# Patient Record
Sex: Male | Born: 1975
Health system: Southern US, Community
[De-identification: ages and names within clinical notes are randomized; demographics above are authoritative.]

## PROBLEM LIST (undated history)

## (undated) DIAGNOSIS — W3400XA Accidental discharge from unspecified firearms or gun, initial encounter: Secondary | ICD-10-CM

## (undated) DIAGNOSIS — M549 Dorsalgia, unspecified: Secondary | ICD-10-CM

## (undated) DIAGNOSIS — R569 Unspecified convulsions: Secondary | ICD-10-CM

## (undated) HISTORY — PX: BACK SURGERY: SHX140

## (undated) HISTORY — PX: ROTATOR CUFF REPAIR: SHX139

## (undated) HISTORY — PX: OTHER SURGICAL HISTORY: SHX169

---

## 1999-12-11 ENCOUNTER — Inpatient Hospital Stay (HOSPITAL_COMMUNITY): Admission: EM | Admit: 1999-12-11 | Discharge: 1999-12-13 | Payer: Self-pay | Admitting: Emergency Medicine

## 1999-12-11 ENCOUNTER — Encounter: Payer: Self-pay | Admitting: Orthopedic Surgery

## 1999-12-11 ENCOUNTER — Encounter: Payer: Self-pay | Admitting: Emergency Medicine

## 2005-01-23 ENCOUNTER — Emergency Department (HOSPITAL_COMMUNITY): Admission: EM | Admit: 2005-01-23 | Discharge: 2005-01-23 | Payer: Self-pay | Admitting: Emergency Medicine

## 2010-07-10 ENCOUNTER — Emergency Department (HOSPITAL_BASED_OUTPATIENT_CLINIC_OR_DEPARTMENT_OTHER)
Admission: EM | Admit: 2010-07-10 | Discharge: 2010-07-10 | Payer: Self-pay | Source: Home / Self Care | Admitting: Emergency Medicine

## 2010-10-22 ENCOUNTER — Emergency Department (HOSPITAL_BASED_OUTPATIENT_CLINIC_OR_DEPARTMENT_OTHER)
Admission: EM | Admit: 2010-10-22 | Discharge: 2010-10-22 | Disposition: A | Discharge status: Home / Self Care | Payer: Self-pay | Attending: Emergency Medicine | Admitting: Emergency Medicine

## 2010-10-22 DIAGNOSIS — M549 Dorsalgia, unspecified: Secondary | ICD-10-CM | POA: Insufficient documentation

## 2010-10-22 DIAGNOSIS — M5380 Other specified dorsopathies, site unspecified: Secondary | ICD-10-CM | POA: Insufficient documentation

## 2010-10-22 DIAGNOSIS — F17200 Nicotine dependence, unspecified, uncomplicated: Secondary | ICD-10-CM | POA: Insufficient documentation
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
## 2011-11-21 ENCOUNTER — Emergency Department (INDEPENDENT_AMBULATORY_CARE_PROVIDER_SITE_OTHER): Payer: Self-pay

## 2011-11-21 ENCOUNTER — Encounter (HOSPITAL_BASED_OUTPATIENT_CLINIC_OR_DEPARTMENT_OTHER): Payer: Self-pay

## 2011-11-21 ENCOUNTER — Emergency Department (HOSPITAL_BASED_OUTPATIENT_CLINIC_OR_DEPARTMENT_OTHER)
Admission: EM | Admit: 2011-11-21 | Discharge: 2011-11-21 | Disposition: A | Discharge status: Home / Self Care | Payer: Self-pay | Attending: Emergency Medicine | Admitting: Emergency Medicine

## 2011-11-21 DIAGNOSIS — R5383 Other fatigue: Secondary | ICD-10-CM

## 2011-11-21 DIAGNOSIS — M79609 Pain in unspecified limb: Secondary | ICD-10-CM

## 2011-11-21 DIAGNOSIS — IMO0002 Reserved for concepts with insufficient information to code with codable children: Secondary | ICD-10-CM | POA: Insufficient documentation

## 2011-11-21 DIAGNOSIS — M5410 Radiculopathy, site unspecified: Secondary | ICD-10-CM

## 2011-11-21 DIAGNOSIS — R5381 Other malaise: Secondary | ICD-10-CM

## 2011-11-21 DIAGNOSIS — M545 Low back pain, unspecified: Secondary | ICD-10-CM | POA: Insufficient documentation

## 2011-11-21 DIAGNOSIS — M5137 Other intervertebral disc degeneration, lumbosacral region: Secondary | ICD-10-CM

## 2011-11-21 HISTORY — DX: Accidental discharge from unspecified firearms or gun, initial encounter: W34.00XA

## 2011-11-21 HISTORY — DX: Unspecified convulsions: R56.9

## 2011-11-21 HISTORY — DX: Dorsalgia, unspecified: M54.9

## 2011-11-21 MED ORDER — PREDNISONE 10 MG PO TABS: 20.0000 mg | ORAL_TABLET | Freq: Two times a day (BID) | ORAL | Status: DC

## 2011-11-21 MED ORDER — OXYCODONE-ACETAMINOPHEN 5-325 MG PO TABS: 1.0000 | ORAL_TABLET | Freq: Four times a day (QID) | ORAL | Status: AC | PRN

## 2011-11-21 MED ORDER — MORPHINE SULFATE 10 MG/ML IJ SOLN
10.0000 mg | Freq: Once | INTRAMUSCULAR | Status: AC
  Administered 2011-11-21: 10 mg via INTRAMUSCULAR
  Filled 2011-11-21: qty 1

## 2011-11-21 MED ORDER — OXYCODONE-ACETAMINOPHEN 5-325 MG PO TABS
2.0000 | ORAL_TABLET | Freq: Once | ORAL | Status: AC
  Administered 2011-11-21: 2 via ORAL
  Filled 2011-11-21: qty 2

## 2011-11-21 NOTE — ED Notes (Signed)
Pt reports back pain x 2 weeks radiating to right leg with numbness.

## 2011-11-21 NOTE — ED Notes (Signed)
MD at bedside. 

## 2011-11-21 NOTE — Discharge Instructions (Signed)

## 2011-11-21 NOTE — ED Notes (Signed)
Pt medicated and preparing for MRI transport.

## 2011-11-21 NOTE — ED Provider Notes (Signed)
History     CSN: [id number]  Arrival date & time 11/21/11  4098   First MD Initiated Contact with Patient 11/21/11 1008      Chief Complaint  Patient presents with  . Back Pain    (Consider location/radiation/quality/duration/timing/severity/associated sxs/prior treatment) HPI Comments: Several month history of low back pain.  Worse the past few days.  Today, walking out of class, became severe, radiated into the right hip and the right foot is numb and weak.  No bowel or bladder complaints.  Patient is a 36 y.o. male presenting with back pain. The history is provided by the patient.  Back Pain  This is a new problem. Episode onset: several months ago. The problem occurs constantly. The pain is associated with no known injury. The pain is present in the lumbar spine. The quality of the pain is described as stabbing. The pain radiates to the right thigh and right foot. The pain is at a severity of 10/10. The pain is severe. The symptoms are aggravated by twisting, bending and certain positions. The pain is the same all the time. He has tried NSAIDs for the symptoms. The treatment provided no relief.    Past Medical History  Diagnosis Date  . Back pain     Past Surgical History  Procedure Date  . Arm surgery     No family history on file.  History  Substance Use Topics  . Smoking status: Current Everyday Smoker -- 0.5 packs/day  . Smokeless tobacco: Not on file  . Alcohol Use: Yes     occasional      Review of Systems  Musculoskeletal: Positive for back pain.  All other systems reviewed and are negative.    Allergies  Septra  Home Medications  No current outpatient prescriptions on file.  BP 136/90  Pulse 60  Temp(Src) 98.1 F (36.7 C) (Oral)  Resp 20  Ht 6' (1.829 m)  Wt 205 lb (92.987 kg)  BMI 27.80 kg/m2  SpO2 95%  Physical Exam  Nursing note and vitals reviewed. Constitutional: He is oriented to person, place, and time. He appears well-developed  and well-nourished.       He appears quite uncomfortable.  HENT:  Head: Normocephalic and atraumatic.  Neck: Normal range of motion. Neck supple.  Abdominal: Soft. Bowel sounds are normal.  Musculoskeletal: He exhibits no edema.       There is ttp in the soft tissues of the lumbar spine.    Neurological: He is alert and oriented to person, place, and time.       DTR's are 1+ and equal in the ble.  Strength is 4/5 in the rle and 5/5 in the lle.    Skin: Skin is dry.    ED Course  Procedures (including critical care time)  Labs Reviewed - No data to display No results found.   No diagnosis found.    MDM  The mri shows bulging discs but no evidence of nerve compression.  He is feeling somewhat better with morphine.  Will discharge to home with prednisone and percocet.  To follow up with pcp if not improving.        Geoffery Lyons, MD 11/21/11 1218

## 2011-11-21 NOTE — ED Notes (Signed)
Sensation present on medial aspect of right lower leg.  Pt has difficult lifting right leg.  Neuro otherwise intact.

## 2011-11-21 NOTE — ED Notes (Signed)
Family at bedside. 

## 2011-11-21 NOTE — ED Notes (Signed)
Pt returned from MRI °

## 2011-11-21 NOTE — ED Notes (Signed)
Patient transported to MRI via wc 

## 2011-11-21 NOTE — ED Notes (Signed)
MRI Tech at bedside.

## 2012-11-11 ENCOUNTER — Emergency Department (HOSPITAL_BASED_OUTPATIENT_CLINIC_OR_DEPARTMENT_OTHER)
Admission: EM | Admit: 2012-11-11 | Discharge: 2012-11-11 | Disposition: A | Discharge status: Home / Self Care | Payer: Self-pay | Attending: Emergency Medicine | Admitting: Emergency Medicine

## 2012-11-11 ENCOUNTER — Encounter (HOSPITAL_BASED_OUTPATIENT_CLINIC_OR_DEPARTMENT_OTHER): Payer: Self-pay | Admitting: *Deleted

## 2012-11-11 DIAGNOSIS — S0990XA Unspecified injury of head, initial encounter: Secondary | ICD-10-CM

## 2012-11-11 DIAGNOSIS — Z8739 Personal history of other diseases of the musculoskeletal system and connective tissue: Secondary | ICD-10-CM | POA: Insufficient documentation

## 2012-11-11 DIAGNOSIS — IMO0002 Reserved for concepts with insufficient information to code with codable children: Secondary | ICD-10-CM | POA: Insufficient documentation

## 2012-11-11 DIAGNOSIS — Y9289 Other specified places as the place of occurrence of the external cause: Secondary | ICD-10-CM | POA: Insufficient documentation

## 2012-11-11 DIAGNOSIS — Y9389 Activity, other specified: Secondary | ICD-10-CM | POA: Insufficient documentation

## 2012-11-11 DIAGNOSIS — W268XXA Contact with other sharp object(s), not elsewhere classified, initial encounter: Secondary | ICD-10-CM | POA: Insufficient documentation

## 2012-11-11 DIAGNOSIS — Z8669 Personal history of other diseases of the nervous system and sense organs: Secondary | ICD-10-CM | POA: Insufficient documentation

## 2012-11-11 DIAGNOSIS — Z87828 Personal history of other (healed) physical injury and trauma: Secondary | ICD-10-CM | POA: Insufficient documentation

## 2012-11-11 DIAGNOSIS — S0005XA Superficial foreign body of scalp, initial encounter: Secondary | ICD-10-CM | POA: Insufficient documentation

## 2012-11-11 DIAGNOSIS — F17200 Nicotine dependence, unspecified, uncomplicated: Secondary | ICD-10-CM | POA: Insufficient documentation

## 2012-11-11 DIAGNOSIS — S1095XA Superficial foreign body of unspecified part of neck, initial encounter: Secondary | ICD-10-CM | POA: Insufficient documentation

## 2012-11-11 DIAGNOSIS — S0085XA Superficial foreign body of other part of head, initial encounter: Secondary | ICD-10-CM | POA: Insufficient documentation

## 2012-11-11 MED ORDER — TETANUS-DIPHTH-ACELL PERTUSSIS 5-2.5-18.5 LF-MCG/0.5 IM SUSP
0.5000 mL | Freq: Once | INTRAMUSCULAR | Status: AC
  Administered 2012-11-11: 0.5 mL via INTRAMUSCULAR
  Filled 2012-11-11: qty 0.5

## 2012-11-11 NOTE — ED Notes (Signed)
Pt c/o fish hook to forehead x 20 mins ago
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
## 2012-11-11 NOTE — ED Notes (Signed)
MD at bedside removing fishhook.

## 2013-06-14 ENCOUNTER — Emergency Department (HOSPITAL_BASED_OUTPATIENT_CLINIC_OR_DEPARTMENT_OTHER)
Admission: EM | Admit: 2013-06-14 | Discharge: 2013-06-14 | Disposition: A | Discharge status: Home / Self Care | Payer: Self-pay | Attending: Emergency Medicine | Admitting: Emergency Medicine

## 2013-06-14 ENCOUNTER — Encounter (HOSPITAL_BASED_OUTPATIENT_CLINIC_OR_DEPARTMENT_OTHER): Payer: Self-pay | Admitting: Emergency Medicine

## 2013-06-14 ENCOUNTER — Emergency Department (HOSPITAL_BASED_OUTPATIENT_CLINIC_OR_DEPARTMENT_OTHER): Payer: Self-pay

## 2013-06-14 DIAGNOSIS — Z8669 Personal history of other diseases of the nervous system and sense organs: Secondary | ICD-10-CM | POA: Insufficient documentation

## 2013-06-14 DIAGNOSIS — Z8739 Personal history of other diseases of the musculoskeletal system and connective tissue: Secondary | ICD-10-CM | POA: Insufficient documentation

## 2013-06-14 DIAGNOSIS — Z79899 Other long term (current) drug therapy: Secondary | ICD-10-CM | POA: Insufficient documentation

## 2013-06-14 DIAGNOSIS — Z87828 Personal history of other (healed) physical injury and trauma: Secondary | ICD-10-CM | POA: Insufficient documentation

## 2013-06-14 DIAGNOSIS — Z791 Long term (current) use of non-steroidal anti-inflammatories (NSAID): Secondary | ICD-10-CM | POA: Insufficient documentation

## 2013-06-14 DIAGNOSIS — F17200 Nicotine dependence, unspecified, uncomplicated: Secondary | ICD-10-CM | POA: Insufficient documentation

## 2013-06-14 DIAGNOSIS — F1010 Alcohol abuse, uncomplicated: Secondary | ICD-10-CM | POA: Insufficient documentation

## 2013-06-14 DIAGNOSIS — K2970 Gastritis, unspecified, without bleeding: Secondary | ICD-10-CM | POA: Insufficient documentation

## 2013-06-14 LAB — CBC WITH DIFFERENTIAL/PLATELET
Basophils Relative: 0 % (ref 0–1)
Hemoglobin: 14.4 g/dL (ref 13.0–17.0)
MCHC: 36.1 g/dL — ABNORMAL HIGH (ref 30.0–36.0)
Monocytes Relative: 8 % (ref 3–12)
Neutro Abs: 3 10*3/uL (ref 1.7–7.7)
Neutrophils Relative %: 53 % (ref 43–77)
Platelets: 177 10*3/uL (ref 150–400)
RBC: 4.56 MIL/uL (ref 4.22–5.81)

## 2013-06-14 LAB — COMPREHENSIVE METABOLIC PANEL WITH GFR
ALT: 17 U/L (ref 0–53)
AST: 15 U/L (ref 0–37)
Albumin: 3.8 g/dL (ref 3.5–5.2)
Alkaline Phosphatase: 44 U/L (ref 39–117)
BUN: 10 mg/dL (ref 6–23)
CO2: 25 meq/L (ref 19–32)
Calcium: 9.3 mg/dL (ref 8.4–10.5)
Chloride: 104 meq/L (ref 96–112)
Creatinine, Ser: 0.9 mg/dL (ref 0.50–1.35)
GFR calc Af Amer: >90 mL/min
GFR calc non Af Amer: >90 mL/min
Glucose, Bld: 97 mg/dL (ref 70–99)
Potassium: 4.1 meq/L (ref 3.5–5.1)
Sodium: 138 meq/L (ref 135–145)
Total Bilirubin: 0.3 mg/dL (ref 0.3–1.2)
Total Protein: 7 g/dL (ref 6.0–8.3)

## 2013-06-14 LAB — LIPASE, BLOOD: Lipase: 21 U/L (ref 11–59)

## 2013-06-14 MED ORDER — GI COCKTAIL ~~LOC~~
30.0000 mL | Freq: Once | ORAL | Status: AC
  Administered 2013-06-14: 30 mL via ORAL
  Filled 2013-06-14: qty 30

## 2013-06-14 MED ORDER — SUCRALFATE 1 GM/10ML PO SUSP: 1.0000 g | Freq: Three times a day (TID) | ORAL | Status: DC

## 2013-06-14 MED ORDER — OMEPRAZOLE 20 MG PO CPDR: 20.0000 mg | DELAYED_RELEASE_CAPSULE | Freq: Every day | ORAL | Status: DC
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
## 2013-06-14 NOTE — ED Notes (Signed)
Dr. Belfi at bedside 

## 2013-06-14 NOTE — ED Notes (Signed)
C/o sudden onset of chest pain 30 minutes PTA.  Pain is upper abdominal, sharp, 'pressure.'  Pain worse with movement.  Denies SHOB, nausea, prior incidence of pain like this.

## 2014-12-22 ENCOUNTER — Emergency Department (HOSPITAL_BASED_OUTPATIENT_CLINIC_OR_DEPARTMENT_OTHER)
Admission: EM | Admit: 2014-12-22 | Discharge: 2014-12-22 | Disposition: A | Discharge status: Home / Self Care | Payer: Self-pay | Attending: Emergency Medicine | Admitting: Emergency Medicine

## 2014-12-22 ENCOUNTER — Emergency Department (HOSPITAL_BASED_OUTPATIENT_CLINIC_OR_DEPARTMENT_OTHER): Payer: Self-pay

## 2014-12-22 ENCOUNTER — Encounter (HOSPITAL_BASED_OUTPATIENT_CLINIC_OR_DEPARTMENT_OTHER): Payer: Self-pay | Admitting: Emergency Medicine

## 2014-12-22 DIAGNOSIS — S63611A Unspecified sprain of left index finger, initial encounter: Secondary | ICD-10-CM | POA: Insufficient documentation

## 2014-12-22 DIAGNOSIS — X58XXXA Exposure to other specified factors, initial encounter: Secondary | ICD-10-CM | POA: Insufficient documentation

## 2014-12-22 DIAGNOSIS — M25511 Pain in right shoulder: Secondary | ICD-10-CM

## 2014-12-22 DIAGNOSIS — S4991XA Unspecified injury of right shoulder and upper arm, initial encounter: Secondary | ICD-10-CM | POA: Insufficient documentation

## 2014-12-22 DIAGNOSIS — Z79899 Other long term (current) drug therapy: Secondary | ICD-10-CM | POA: Insufficient documentation

## 2014-12-22 DIAGNOSIS — Y998 Other external cause status: Secondary | ICD-10-CM | POA: Insufficient documentation

## 2014-12-22 DIAGNOSIS — Z87828 Personal history of other (healed) physical injury and trauma: Secondary | ICD-10-CM | POA: Insufficient documentation

## 2014-12-22 DIAGNOSIS — Y9389 Activity, other specified: Secondary | ICD-10-CM | POA: Insufficient documentation

## 2014-12-22 DIAGNOSIS — Y9289 Other specified places as the place of occurrence of the external cause: Secondary | ICD-10-CM | POA: Insufficient documentation

## 2014-12-22 DIAGNOSIS — Z720 Tobacco use: Secondary | ICD-10-CM | POA: Insufficient documentation

## 2014-12-22 MED ORDER — MELOXICAM 7.5 MG PO TABS: 7.5000 mg | ORAL_TABLET | Freq: Every day | ORAL | Status: DC

## 2014-12-22 MED ORDER — IBUPROFEN 800 MG PO TABS
800.0000 mg | ORAL_TABLET | Freq: Once | ORAL | Status: AC
  Administered 2014-12-22: 800 mg via ORAL
  Filled 2014-12-22: qty 1

## 2014-12-22 NOTE — ED Notes (Signed)
PA at bedside.

## 2014-12-22 NOTE — ED Notes (Signed)
Emt at bedside to apply finger splint
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
## 2014-12-22 NOTE — ED Notes (Signed)
Pt directed to pharmacy to pick up meds 
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
## 2015-07-23 ENCOUNTER — Encounter (HOSPITAL_BASED_OUTPATIENT_CLINIC_OR_DEPARTMENT_OTHER): Payer: Self-pay | Admitting: *Deleted

## 2015-07-23 ENCOUNTER — Emergency Department (HOSPITAL_BASED_OUTPATIENT_CLINIC_OR_DEPARTMENT_OTHER)
Admission: EM | Admit: 2015-07-23 | Discharge: 2015-07-23 | Disposition: A | Discharge status: Home / Self Care | Payer: No Typology Code available for payment source | Attending: Emergency Medicine | Admitting: Emergency Medicine

## 2015-07-23 DIAGNOSIS — F17200 Nicotine dependence, unspecified, uncomplicated: Secondary | ICD-10-CM | POA: Diagnosis not present

## 2015-07-23 DIAGNOSIS — Z791 Long term (current) use of non-steroidal anti-inflammatories (NSAID): Secondary | ICD-10-CM | POA: Insufficient documentation

## 2015-07-23 DIAGNOSIS — Y998 Other external cause status: Secondary | ICD-10-CM | POA: Insufficient documentation

## 2015-07-23 DIAGNOSIS — S4991XA Unspecified injury of right shoulder and upper arm, initial encounter: Secondary | ICD-10-CM | POA: Insufficient documentation

## 2015-07-23 DIAGNOSIS — S3992XA Unspecified injury of lower back, initial encounter: Secondary | ICD-10-CM | POA: Diagnosis present

## 2015-07-23 DIAGNOSIS — S39012A Strain of muscle, fascia and tendon of lower back, initial encounter: Secondary | ICD-10-CM

## 2015-07-23 DIAGNOSIS — Y9389 Activity, other specified: Secondary | ICD-10-CM | POA: Diagnosis not present

## 2015-07-23 DIAGNOSIS — Y92410 Unspecified street and highway as the place of occurrence of the external cause: Secondary | ICD-10-CM | POA: Insufficient documentation

## 2015-07-23 MED ORDER — NAPROXEN 250 MG PO TABS
500.0000 mg | ORAL_TABLET | Freq: Once | ORAL | Status: AC
  Administered 2015-07-23: 500 mg via ORAL
  Filled 2015-07-23: qty 2

## 2015-07-23 MED ORDER — NAPROXEN 500 MG PO TABS: 500.0000 mg | ORAL_TABLET | Freq: Two times a day (BID) | ORAL | Status: DC

## 2015-07-23 MED ORDER — BUPIVACAINE HCL 0.5 % IJ SOLN
20.0000 mL | Freq: Once | INTRAMUSCULAR | Status: AC
  Administered 2015-07-23: 20 mL
  Filled 2015-07-23: qty 1
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
## 2016-10-29 ENCOUNTER — Emergency Department (HOSPITAL_BASED_OUTPATIENT_CLINIC_OR_DEPARTMENT_OTHER)
Admission: EM | Admit: 2016-10-29 | Discharge: 2016-10-29 | Disposition: A | Discharge status: Home / Self Care | Payer: Self-pay | Attending: Emergency Medicine | Admitting: Emergency Medicine

## 2016-10-29 ENCOUNTER — Encounter (HOSPITAL_BASED_OUTPATIENT_CLINIC_OR_DEPARTMENT_OTHER): Payer: Self-pay | Admitting: *Deleted

## 2016-10-29 DIAGNOSIS — Z79899 Other long term (current) drug therapy: Secondary | ICD-10-CM | POA: Insufficient documentation

## 2016-10-29 DIAGNOSIS — F17200 Nicotine dependence, unspecified, uncomplicated: Secondary | ICD-10-CM | POA: Insufficient documentation

## 2016-10-29 DIAGNOSIS — M5410 Radiculopathy, site unspecified: Secondary | ICD-10-CM

## 2016-10-29 DIAGNOSIS — M5416 Radiculopathy, lumbar region: Secondary | ICD-10-CM | POA: Insufficient documentation

## 2016-10-29 DIAGNOSIS — F1290 Cannabis use, unspecified, uncomplicated: Secondary | ICD-10-CM | POA: Insufficient documentation

## 2016-10-29 MED ORDER — METHOCARBAMOL 1000 MG/10ML IJ SOLN: 1000.0000 mg | Freq: Once | INTRAMUSCULAR | Status: DC

## 2016-10-29 MED ORDER — PREDNISONE 10 MG PO TABS: 20.0000 mg | ORAL_TABLET | Freq: Two times a day (BID) | ORAL | 0 refills | Status: DC

## 2016-10-29 MED ORDER — KETOROLAC TROMETHAMINE 60 MG/2ML IM SOLN
60.0000 mg | Freq: Once | INTRAMUSCULAR | Status: AC
  Administered 2016-10-29: 60 mg via INTRAMUSCULAR
  Filled 2016-10-29: qty 2

## 2016-10-29 MED ORDER — METHOCARBAMOL 500 MG PO TABS
1000.0000 mg | ORAL_TABLET | Freq: Once | ORAL | Status: AC
  Administered 2016-10-29: 1000 mg via ORAL
  Filled 2016-10-29: qty 2

## 2016-10-29 MED ORDER — HYDROCODONE-ACETAMINOPHEN 5-325 MG PO TABS: 1.0000 | ORAL_TABLET | Freq: Four times a day (QID) | ORAL | 0 refills | Status: DC | PRN
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
## 2016-10-29 NOTE — ED Triage Notes (Signed)
h/o chronic back pain with lumbar fusion [facility] 2015), acute onset last night, unable to find position of comfort, no relief with ibuprofen  at 2200, describes as aching and throbbing, and spasms, (denies: injury, fall, radiation, numbness, tingling, weakness, loss of control of bowel or bladder, urinary sx, fever, or nvd). Was putting on a back brace to go to work this morning and pain became unbearable, works Environmental manager. Dr. Judd Lien into room.  Alert, guarding, bracing, tearful, unable to find positon of comfort, interactive, no dyspnea noted, skin W&D.

## 2016-10-29 NOTE — Discharge Instructions (Signed)
Prednisone as prescribed.  Ibuprofen 600 mg every 8 hours as needed for pain.  Hydrocodone as prescribed as needed for pain not relieved with ibuprofen.  Follow-up with your primary Dr. or neurosurgeon if you're not improving in the next week.

## 2017-04-09 ENCOUNTER — Emergency Department (HOSPITAL_BASED_OUTPATIENT_CLINIC_OR_DEPARTMENT_OTHER): Payer: Self-pay

## 2017-04-09 ENCOUNTER — Encounter (HOSPITAL_BASED_OUTPATIENT_CLINIC_OR_DEPARTMENT_OTHER): Payer: Self-pay | Admitting: Adult Health

## 2017-04-09 ENCOUNTER — Emergency Department (HOSPITAL_BASED_OUTPATIENT_CLINIC_OR_DEPARTMENT_OTHER)
Admission: EM | Admit: 2017-04-09 | Discharge: 2017-04-09 | Disposition: A | Discharge status: Home / Self Care | Payer: Self-pay | Attending: Emergency Medicine | Admitting: Emergency Medicine

## 2017-04-09 DIAGNOSIS — M542 Cervicalgia: Secondary | ICD-10-CM

## 2017-04-09 DIAGNOSIS — Z79899 Other long term (current) drug therapy: Secondary | ICD-10-CM | POA: Insufficient documentation

## 2017-04-09 DIAGNOSIS — M79605 Pain in left leg: Secondary | ICD-10-CM

## 2017-04-09 DIAGNOSIS — Z87891 Personal history of nicotine dependence: Secondary | ICD-10-CM | POA: Insufficient documentation

## 2017-04-09 MED ORDER — IBUPROFEN 800 MG PO TABS: 800.0000 mg | ORAL_TABLET | Freq: Three times a day (TID) | ORAL | 0 refills | Status: DC

## 2017-04-09 MED ORDER — METHOCARBAMOL 500 MG PO TABS: 500.0000 mg | ORAL_TABLET | Freq: Two times a day (BID) | ORAL | 0 refills | Status: DC

## 2017-04-09 MED ORDER — KETOROLAC TROMETHAMINE 30 MG/ML IJ SOLN
30.0000 mg | Freq: Once | INTRAMUSCULAR | Status: AC
  Administered 2017-04-09: 30 mg via INTRAVENOUS
  Filled 2017-04-09: qty 1

## 2017-04-09 MED ORDER — METHOCARBAMOL 1000 MG/10ML IJ SOLN
1000.0000 mg | Freq: Once | INTRAMUSCULAR | Status: AC
  Administered 2017-04-09: 1000 mg via INTRAVENOUS
  Filled 2017-04-09: qty 10
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
## 2017-04-09 NOTE — ED Triage Notes (Signed)
Presents post fall after having increased weakness and pain in left leg, left side of neck and left side of back. He reports "I went to get up and tried to step with my left leg and it just went out and I Larey Seat on my left side" Endorses base of neck pain and left side pain. GIven of fentanyl. HX of chronic back pain and chronic neck pain that is worseing over last two weeks. Denies loss of B and B. CMS intact/

## 2017-04-09 NOTE — Discharge Instructions (Addendum)
Keep soft collar on for potential ligamentous injury. Follow-up with neurosurgery, call to make appointment Your CT scans shows no broken bone. There is mild soft tissue swelling of the neck that could be neck strain or ligamentous injury Continue ibuprofen and muscle relaxants Return for worsening symptoms, including inability to walk, new numbness/weakness or any other symptoms concerning to you.

## 2019-01-02 ENCOUNTER — Other Ambulatory Visit: Payer: Self-pay

## 2019-01-02 ENCOUNTER — Emergency Department (HOSPITAL_BASED_OUTPATIENT_CLINIC_OR_DEPARTMENT_OTHER)
Admission: EM | Admit: 2019-01-02 | Discharge: 2019-01-02 | Disposition: A | Discharge status: Home / Self Care | Payer: Self-pay | Attending: Emergency Medicine | Admitting: Emergency Medicine

## 2019-01-02 ENCOUNTER — Encounter (HOSPITAL_BASED_OUTPATIENT_CLINIC_OR_DEPARTMENT_OTHER): Payer: Self-pay | Admitting: *Deleted

## 2019-01-02 DIAGNOSIS — R109 Unspecified abdominal pain: Secondary | ICD-10-CM

## 2019-01-02 DIAGNOSIS — R1032 Left lower quadrant pain: Secondary | ICD-10-CM | POA: Insufficient documentation

## 2019-01-02 DIAGNOSIS — F17210 Nicotine dependence, cigarettes, uncomplicated: Secondary | ICD-10-CM | POA: Insufficient documentation

## 2019-01-02 DIAGNOSIS — R319 Hematuria, unspecified: Secondary | ICD-10-CM | POA: Insufficient documentation

## 2019-01-02 LAB — BASIC METABOLIC PANEL
Anion gap: 7 (ref 5–15)
BUN: 18 mg/dL (ref 6–20)
CO2: 22 mmol/L (ref 22–32)
Calcium: 8.9 mg/dL (ref 8.9–10.3)
Chloride: 106 mmol/L (ref 98–111)
Creatinine, Ser: 0.85 mg/dL (ref 0.61–1.24)
GFR calc Af Amer: >60 mL/min (ref >60)
GFR calc non Af Amer: >60 mL/min (ref >60)
Glucose, Bld: 108 mg/dL — ABNORMAL HIGH (ref 70–99)
Potassium: 4 mmol/L (ref 3.5–5.1)
Sodium: 135 mmol/L (ref 135–145)

## 2019-01-02 LAB — CBC
HCT: 41.2 % (ref 39.0–52.0)
Hemoglobin: 14.4 g/dL (ref 13.0–17.0)
MCH: 32.3 pg (ref 26.0–34.0)
MCHC: 35 g/dL (ref 30.0–36.0)
MCV: 92.4 fL (ref 80.0–100.0)
Platelets: 165 10*3/uL (ref 150–400)
RBC: 4.46 MIL/uL (ref 4.22–5.81)
RDW: 11 % — ABNORMAL LOW (ref 11.5–15.5)
WBC: 6.7 10*3/uL (ref 4.0–10.5)
nRBC: 0 % (ref 0.0–0.2)

## 2019-01-02 LAB — URINALYSIS, ROUTINE W REFLEX MICROSCOPIC
Bilirubin Urine: NEGATIVE
Glucose, UA: NEGATIVE mg/dL
Hgb urine dipstick: NEGATIVE
Ketones, ur: NEGATIVE mg/dL
Nitrite: NEGATIVE
Protein, ur: NEGATIVE mg/dL
Specific Gravity, Urine: 1.02 (ref 1.005–1.030)
pH: 7.5 (ref 5.0–8.0)

## 2019-01-02 LAB — URINALYSIS, MICROSCOPIC (REFLEX)

## 2019-01-02 MED ORDER — KETOROLAC TROMETHAMINE 30 MG/ML IJ SOLN
30.0000 mg | Freq: Once | INTRAMUSCULAR | Status: AC
  Administered 2019-01-02: 30 mg via INTRAMUSCULAR
  Filled 2019-01-02: qty 1

## 2019-01-02 MED ORDER — NAPROXEN 500 MG PO TABS: 500.0000 mg | ORAL_TABLET | Freq: Two times a day (BID) | ORAL | 0 refills | Status: DC

## 2019-01-02 MED ORDER — OXYCODONE HCL 5 MG PO TABS: 10.0000 mg | ORAL_TABLET | Freq: Once | ORAL | Status: DC

## 2019-01-02 NOTE — ED Notes (Signed)
Pt verbalized understanding of dc instructions.
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
## 2019-01-02 NOTE — ED Notes (Signed)
C/O of dysuria

## 2019-01-02 NOTE — Discharge Instructions (Signed)
You were evaluated in the Emergency Department and after careful evaluation, we did not find any emergent condition requiring admission or further testing in the hospital.  Your symptoms today seem to be due to a kidney stone.  Use the anti-inflammatory medication provided for pain and follow-up with urology.  Please return to the Emergency Department if you experience any worsening of your condition.  We encourage you to follow up with a primary care provider.  Thank you for allowing [place] to be a part of your care.

## 2019-01-02 NOTE — ED Triage Notes (Signed)
Abdominal pain x 2 days. No penile discharge.

## 2019-01-02 NOTE — ED Notes (Signed)
ED Provider at bedside. 

## 2019-01-03 LAB — GC/CHLAMYDIA PROBE AMP (~~LOC~~) NOT AT ARMC
Chlamydia: NEGATIVE
Neisseria Gonorrhea: NEGATIVE

## 2019-10-29 ENCOUNTER — Emergency Department (HOSPITAL_BASED_OUTPATIENT_CLINIC_OR_DEPARTMENT_OTHER): Payer: 59

## 2019-10-29 ENCOUNTER — Other Ambulatory Visit: Payer: Self-pay

## 2019-10-29 ENCOUNTER — Emergency Department (HOSPITAL_BASED_OUTPATIENT_CLINIC_OR_DEPARTMENT_OTHER)
Admission: EM | Admit: 2019-10-29 | Discharge: 2019-10-29 | Disposition: A | Discharge status: Home / Self Care | Payer: 59 | Attending: Emergency Medicine | Admitting: Emergency Medicine

## 2019-10-29 ENCOUNTER — Encounter (HOSPITAL_BASED_OUTPATIENT_CLINIC_OR_DEPARTMENT_OTHER): Payer: Self-pay

## 2019-10-29 DIAGNOSIS — R200 Anesthesia of skin: Secondary | ICD-10-CM | POA: Diagnosis not present

## 2019-10-29 DIAGNOSIS — Y998 Other external cause status: Secondary | ICD-10-CM | POA: Diagnosis not present

## 2019-10-29 DIAGNOSIS — Y929 Unspecified place or not applicable: Secondary | ICD-10-CM | POA: Insufficient documentation

## 2019-10-29 DIAGNOSIS — Y9389 Activity, other specified: Secondary | ICD-10-CM | POA: Insufficient documentation

## 2019-10-29 DIAGNOSIS — W228XXA Striking against or struck by other objects, initial encounter: Secondary | ICD-10-CM | POA: Diagnosis not present

## 2019-10-29 DIAGNOSIS — S6991XA Unspecified injury of right wrist, hand and finger(s), initial encounter: Secondary | ICD-10-CM | POA: Diagnosis not present

## 2019-10-29 DIAGNOSIS — F17210 Nicotine dependence, cigarettes, uncomplicated: Secondary | ICD-10-CM | POA: Diagnosis not present

## 2019-10-29 DIAGNOSIS — F1210 Cannabis abuse, uncomplicated: Secondary | ICD-10-CM | POA: Insufficient documentation

## 2019-10-29 NOTE — Discharge Instructions (Signed)
Please call today to make an appointment with hand surgery.  I have included the information for Dr. Arita Miss.  Please use Tylenol or ibuprofen for pain.  You may use 600 mg ibuprofen every 6 hours or 1000 mg of Tylenol every 6 hours.  You may choose to alternate between the 2.  This would be most effective.  Not to exceed 4 g of Tylenol within 24 hours.  Not to exceed 3200 mg ibuprofen 24 hours.

## 2019-10-29 NOTE — ED Triage Notes (Signed)
Pt states hit right hand into edge of door 2 weeks ago.  Continues to be painful and swollen.  Had previous fracture 20 years ago

## 2019-10-29 NOTE — ED Notes (Signed)
Pt to xr 
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
## 2020-01-02 ENCOUNTER — Encounter (HOSPITAL_BASED_OUTPATIENT_CLINIC_OR_DEPARTMENT_OTHER): Payer: Self-pay | Admitting: Emergency Medicine

## 2020-01-02 ENCOUNTER — Emergency Department (HOSPITAL_BASED_OUTPATIENT_CLINIC_OR_DEPARTMENT_OTHER)
Admission: EM | Admit: 2020-01-02 | Discharge: 2020-01-02 | Disposition: A | Discharge status: Home / Self Care | Payer: 59 | Attending: Emergency Medicine | Admitting: Emergency Medicine

## 2020-01-02 ENCOUNTER — Other Ambulatory Visit: Payer: Self-pay

## 2020-01-02 DIAGNOSIS — Z5321 Procedure and treatment not carried out due to patient leaving prior to being seen by health care provider: Secondary | ICD-10-CM | POA: Diagnosis not present

## 2020-01-02 DIAGNOSIS — R200 Anesthesia of skin: Secondary | ICD-10-CM | POA: Diagnosis not present

## 2020-01-02 DIAGNOSIS — M549 Dorsalgia, unspecified: Secondary | ICD-10-CM | POA: Diagnosis present

## 2020-01-02 NOTE — ED Triage Notes (Addendum)
PT here with left sided back pain x 1 month that has gotten progressively worse. Left leg numbness. Hx of spinal fusion in 2014.

## 2020-01-06 ENCOUNTER — Emergency Department (HOSPITAL_BASED_OUTPATIENT_CLINIC_OR_DEPARTMENT_OTHER): Payer: 59

## 2020-01-06 ENCOUNTER — Encounter (HOSPITAL_BASED_OUTPATIENT_CLINIC_OR_DEPARTMENT_OTHER): Payer: Self-pay | Admitting: Emergency Medicine

## 2020-01-06 ENCOUNTER — Emergency Department (HOSPITAL_BASED_OUTPATIENT_CLINIC_OR_DEPARTMENT_OTHER)
Admission: EM | Admit: 2020-01-06 | Discharge: 2020-01-06 | Disposition: A | Discharge status: Home / Self Care | Payer: 59 | Attending: Emergency Medicine | Admitting: Emergency Medicine

## 2020-01-06 ENCOUNTER — Other Ambulatory Visit: Payer: Self-pay

## 2020-01-06 DIAGNOSIS — F17200 Nicotine dependence, unspecified, uncomplicated: Secondary | ICD-10-CM | POA: Diagnosis not present

## 2020-01-06 DIAGNOSIS — M5137 Other intervertebral disc degeneration, lumbosacral region: Secondary | ICD-10-CM

## 2020-01-06 DIAGNOSIS — M545 Low back pain, unspecified: Secondary | ICD-10-CM

## 2020-01-06 DIAGNOSIS — M5136 Other intervertebral disc degeneration, lumbar region: Secondary | ICD-10-CM | POA: Insufficient documentation

## 2020-01-06 MED ORDER — PREDNISONE 20 MG PO TABS: ORAL_TABLET | ORAL | 0 refills | Status: AC

## 2020-01-06 MED ORDER — METHOCARBAMOL 750 MG PO TABS: 750.0000 mg | ORAL_TABLET | Freq: Three times a day (TID) | ORAL | 0 refills | Status: DC | PRN

## 2020-01-06 MED ORDER — PREDNISONE 50 MG PO TABS
60.0000 mg | ORAL_TABLET | Freq: Once | ORAL | Status: AC
  Administered 2020-01-06: 60 mg via ORAL
  Filled 2020-01-06: qty 1

## 2020-01-06 MED ORDER — HYDROMORPHONE HCL 1 MG/ML IJ SOLN
1.0000 mg | Freq: Once | INTRAMUSCULAR | Status: AC
  Administered 2020-01-06: 1 mg via INTRAMUSCULAR
  Filled 2020-01-06: qty 1

## 2020-01-06 MED FILL — METHOCARBAMOL 750 MG TABS: 750 | 6 days supply | Qty: 20 | Fill #0

## 2020-01-06 MED FILL — predniSONE 20 MG TABS: 20 | 8 days supply | Qty: 15 | Fill #0

## 2020-01-06 NOTE — ED Triage Notes (Signed)
Pt endorses back surgery 2015. Pt arrives pov c/o chronic left lower back pain and back spasms. Pt denies numbness or tingling, reports pain "makes me fall"

## 2020-01-06 NOTE — Discharge Instructions (Addendum)
It was our pleasure to provide your ER care today - we hope that you feel better.  Take prednisone as prescribed. Take acetaminophen as need for pain. You may also take robaxin as need for muscle pain/spasm - no driving when taking.   Try to find position of comfort - for example, lying on back with pillow(s) under knees, or on your side with pillow between knees. You may also try heat  therapy.   Follow up with back/spine specialist in the next 1-2 weeks. Discuss possible advanced imaging if symptoms fail to improve/resolve.  Return to ER if  worse, new symptoms, fevers, severe or intractable pain, numbness/weakness, problems with bowel or bladder function, or other concern.   You were given pain medication in the ER - no driving for the next 6 hours.

## 2020-01-06 NOTE — ED Notes (Signed)
Patient transported to X-ray 

## 2020-01-06 NOTE — ED Notes (Signed)
ED Provider at bedside. 

## 2020-01-06 NOTE — ED Notes (Signed)
Pt given urinal.
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
## 2020-03-18 ENCOUNTER — Emergency Department (HOSPITAL_BASED_OUTPATIENT_CLINIC_OR_DEPARTMENT_OTHER)
Admission: EM | Admit: 2020-03-18 | Discharge: 2020-03-18 | Disposition: A | Discharge status: Home / Self Care | Payer: 59 | Attending: Emergency Medicine | Admitting: Emergency Medicine

## 2020-03-18 ENCOUNTER — Emergency Department (HOSPITAL_BASED_OUTPATIENT_CLINIC_OR_DEPARTMENT_OTHER): Payer: 59

## 2020-03-18 ENCOUNTER — Encounter (HOSPITAL_BASED_OUTPATIENT_CLINIC_OR_DEPARTMENT_OTHER): Payer: Self-pay | Admitting: Emergency Medicine

## 2020-03-18 ENCOUNTER — Other Ambulatory Visit: Payer: Self-pay

## 2020-03-18 DIAGNOSIS — M545 Low back pain: Secondary | ICD-10-CM | POA: Insufficient documentation

## 2020-03-18 DIAGNOSIS — M542 Cervicalgia: Secondary | ICD-10-CM | POA: Insufficient documentation

## 2020-03-18 DIAGNOSIS — M25511 Pain in right shoulder: Secondary | ICD-10-CM | POA: Insufficient documentation

## 2020-03-18 DIAGNOSIS — F17200 Nicotine dependence, unspecified, uncomplicated: Secondary | ICD-10-CM | POA: Diagnosis not present

## 2020-03-18 DIAGNOSIS — M7918 Myalgia, other site: Secondary | ICD-10-CM

## 2020-03-18 MED ORDER — KETOROLAC TROMETHAMINE 15 MG/ML IJ SOLN
30.0000 mg | Freq: Once | INTRAMUSCULAR | Status: AC
  Administered 2020-03-18: 22:00:00 30 mg via INTRAMUSCULAR
  Filled 2020-03-18: qty 2

## 2020-03-18 MED ORDER — METHOCARBAMOL 500 MG PO TABS: 1000.0000 mg | ORAL_TABLET | Freq: Four times a day (QID) | ORAL | 0 refills | Status: DC

## 2020-03-18 MED ORDER — METHOCARBAMOL 500 MG PO TABS
1000.0000 mg | ORAL_TABLET | Freq: Once | ORAL | Status: AC
  Administered 2020-03-18: 22:00:00 1000 mg via ORAL
  Filled 2020-03-18: qty 2
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
## 2022-07-10 ENCOUNTER — Other Ambulatory Visit: Payer: Self-pay

## 2022-07-10 ENCOUNTER — Emergency Department (HOSPITAL_BASED_OUTPATIENT_CLINIC_OR_DEPARTMENT_OTHER): Payer: Self-pay

## 2022-07-10 ENCOUNTER — Emergency Department (HOSPITAL_BASED_OUTPATIENT_CLINIC_OR_DEPARTMENT_OTHER)
Admission: EM | Admit: 2022-07-10 | Discharge: 2022-07-10 | Discharge status: Against Medical Advice | Payer: Self-pay | Attending: Emergency Medicine | Admitting: Emergency Medicine

## 2022-07-10 DIAGNOSIS — R0602 Shortness of breath: Secondary | ICD-10-CM | POA: Insufficient documentation

## 2022-07-10 DIAGNOSIS — Z5321 Procedure and treatment not carried out due to patient leaving prior to being seen by health care provider: Secondary | ICD-10-CM | POA: Insufficient documentation

## 2022-07-10 DIAGNOSIS — R079 Chest pain, unspecified: Secondary | ICD-10-CM | POA: Insufficient documentation

## 2022-07-10 LAB — BASIC METABOLIC PANEL
Anion gap: 6 (ref 5–15)
BUN: 12 mg/dL (ref 6–20)
CO2: 23 mmol/L (ref 22–32)
Calcium: 9.1 mg/dL (ref 8.9–10.3)
Chloride: 107 mmol/L (ref 98–111)
Creatinine, Ser: 0.94 mg/dL (ref 0.61–1.24)
GFR, Estimated: >60 mL/min (ref >60)
Glucose, Bld: 99 mg/dL (ref 70–99)
Potassium: 3.9 mmol/L (ref 3.5–5.1)
Sodium: 136 mmol/L (ref 135–145)

## 2022-07-10 LAB — CBC
HCT: 41.3 % (ref 39.0–52.0)
Hemoglobin: 14.7 g/dL (ref 13.0–17.0)
MCH: 32 pg (ref 26.0–34.0)
MCHC: 35.6 g/dL (ref 30.0–36.0)
MCV: 89.8 fL (ref 80.0–100.0)
Platelets: 186 10*3/uL (ref 150–400)
RBC: 4.6 MIL/uL (ref 4.22–5.81)
RDW: 11 % — ABNORMAL LOW (ref 11.5–15.5)
WBC: 6.4 10*3/uL (ref 4.0–10.5)
nRBC: 0 % (ref 0.0–0.2)

## 2022-07-10 LAB — TROPONIN I (HIGH SENSITIVITY): Troponin I (High Sensitivity): 5 ng/L (ref <18)

## 2022-07-10 NOTE — ED Triage Notes (Signed)
Patient presents to ED via POV from home. Here with chest pain that began on Friday. Denies pain radiation. Denies nausea and vomiting. Endorses shortness of breath.

## 2023-03-15 ENCOUNTER — Encounter (HOSPITAL_BASED_OUTPATIENT_CLINIC_OR_DEPARTMENT_OTHER): Payer: Self-pay | Admitting: Emergency Medicine

## 2023-03-15 ENCOUNTER — Other Ambulatory Visit: Payer: Self-pay

## 2023-03-15 DIAGNOSIS — R799 Abnormal finding of blood chemistry, unspecified: Secondary | ICD-10-CM | POA: Insufficient documentation

## 2023-03-15 DIAGNOSIS — L03115 Cellulitis of right lower limb: Secondary | ICD-10-CM | POA: Insufficient documentation

## 2023-03-15 DIAGNOSIS — M79651 Pain in right thigh: Secondary | ICD-10-CM | POA: Diagnosis present

## 2023-03-15 NOTE — ED Triage Notes (Addendum)
Right anterior lateral thigh redness and pain onset today. Tender with palpation. Describes pain as "burning", constant. Denies injury. Denies exposure to irritant.   Area 15 cm x 20 cm. Area outlined in triage.

## 2023-03-16 ENCOUNTER — Emergency Department (HOSPITAL_BASED_OUTPATIENT_CLINIC_OR_DEPARTMENT_OTHER)
Admission: EM | Admit: 2023-03-16 | Discharge: 2023-03-16 | Disposition: A | Discharge status: Home / Self Care | Payer: BLUE CROSS/BLUE SHIELD | Attending: Emergency Medicine | Admitting: Emergency Medicine

## 2023-03-16 DIAGNOSIS — L03115 Cellulitis of right lower limb: Secondary | ICD-10-CM

## 2023-03-16 LAB — CBG MONITORING, ED: Glucose-Capillary: 89 mg/dL (ref 70–99)

## 2023-03-16 MED ORDER — DOXYCYCLINE HYCLATE 100 MG PO CAPS: 100.0000 mg | ORAL_CAPSULE | Freq: Two times a day (BID) | ORAL | 0 refills | Status: AC

## 2023-03-16 MED ORDER — DOXYCYCLINE HYCLATE 100 MG PO TABS
100.0000 mg | ORAL_TABLET | Freq: Once | ORAL | Status: AC
  Administered 2023-03-16: 100 mg via ORAL
  Filled 2023-03-16: qty 1

## 2023-03-16 NOTE — ED Provider Notes (Signed)
[place] EMERGENCY DEPARTMENT AT [facility]  Provider Note  CSN: [id number] Arrival date & time: 03/15/23 2239  History Chief Complaint  Patient presents with   Leg Pain   Rash    Stephen Mccarthy is a 47 y.o. male with prior diagnosis of 'pre-diabetes' not currently on any medications reports several hours of redness and pain to R thigh, started as a burning after the shower. No recent injuries. No prior similar.    Home Medications Prior to Admission medications   Medication Sig Start Date End Date Taking? Authorizing Provider  doxycycline (VIBRAMYCIN) 100 MG capsule Take 1 capsule (100 mg total) by mouth 2 (two) times daily. 03/16/23  Yes Pollyann Savoy, MD  methocarbamol (ROBAXIN) 500 MG tablet Take 2 tablets (1,000 mg total) by mouth 4 (four) times daily. 03/18/20   Renne Crigler, PA-C  predniSONE (DELTASONE) 20 MG tablet 3 po once a day for 2 days, then 2 po once a day for 3 days, then 1 po once a day for 3 days 01/07/20   Cathren Laine, MD     Allergies    Sulfamethoxazole-trimethoprim and Sulfa antibiotics   Review of Systems   Review of Systems Please see HPI for pertinent positives and negatives  Physical Exam BP (!) 144/91 (BP Location: Left Arm)   Pulse (!) 52   Temp 98 F (36.7 C) (Oral)   Resp 16   Ht 6' (1.829 m)   Wt 86.2 kg   SpO2 100%   BMI 25.77 kg/m   Physical Exam Vitals and nursing note reviewed.  HENT:     Head: Normocephalic.     Nose: Nose normal.  Eyes:     Extraocular Movements: Extraocular movements intact.  Pulmonary:     Effort: Pulmonary effort is normal.  Musculoskeletal:        General: Normal range of motion.     Cervical back: Neck supple.  Skin:    Findings: Rash (large area of erythema, induration and warmth to R thigh, no fluctuance) present.  Neurological:     Mental Status: He is alert and oriented to person, place, and time.  Psychiatric:        Mood and Affect: Mood normal.     ED Results /  Procedures / Treatments   EKG None  Procedures Procedures  Medications Ordered in the ED Medications  doxycycline (VIBRA-TABS) tablet 100 mg (has no administration in time range)    Initial Impression and Plan  Patient here with large area of cellulitis to R thigh. Will check sugar to make sure he is not diabetic after prior diagnosis of pre-diabetes but no recent follow up. Plan Rx for doxycycline, close 48 hour wound check, RTED sooner for worsening pain, spreading redness or fever.   ED Course       MDM Rules/Calculators/A&P Medical Decision Making Problems Addressed: Cellulitis of right lower extremity: acute illness or injury  Amount and/or Complexity of Data Reviewed Labs: ordered. Decision-making details documented in ED Course.  Risk Prescription drug management.     Final Clinical Impression(s) / ED Diagnoses Final diagnoses:  Cellulitis of right lower extremity    Rx / DC Orders ED Discharge Orders          Ordered    doxycycline (VIBRAMYCIN) 100 MG capsule  2 times daily        03/16/23 0229             Pollyann Savoy, MD 03/16/23 [phone]

## 2023-07-22 ENCOUNTER — Encounter (HOSPITAL_BASED_OUTPATIENT_CLINIC_OR_DEPARTMENT_OTHER): Payer: Self-pay

## 2023-07-22 ENCOUNTER — Emergency Department (HOSPITAL_BASED_OUTPATIENT_CLINIC_OR_DEPARTMENT_OTHER)
Admission: EM | Admit: 2023-07-22 | Discharge: 2023-07-22 | Disposition: A | Discharge status: Home / Self Care | Payer: BLUE CROSS/BLUE SHIELD

## 2023-07-22 ENCOUNTER — Emergency Department (HOSPITAL_BASED_OUTPATIENT_CLINIC_OR_DEPARTMENT_OTHER): Payer: BLUE CROSS/BLUE SHIELD

## 2023-07-22 ENCOUNTER — Other Ambulatory Visit: Payer: Self-pay

## 2023-07-22 DIAGNOSIS — M7989 Other specified soft tissue disorders: Secondary | ICD-10-CM | POA: Diagnosis not present

## 2023-07-22 DIAGNOSIS — R109 Unspecified abdominal pain: Secondary | ICD-10-CM | POA: Insufficient documentation

## 2023-07-22 DIAGNOSIS — M5412 Radiculopathy, cervical region: Secondary | ICD-10-CM | POA: Insufficient documentation

## 2023-07-22 DIAGNOSIS — G8929 Other chronic pain: Secondary | ICD-10-CM | POA: Diagnosis not present

## 2023-07-22 DIAGNOSIS — M545 Low back pain, unspecified: Secondary | ICD-10-CM | POA: Insufficient documentation

## 2023-07-22 DIAGNOSIS — R200 Anesthesia of skin: Secondary | ICD-10-CM | POA: Diagnosis present

## 2023-07-22 LAB — CBC
HCT: 42.1 % (ref 39.0–52.0)
Hemoglobin: 15.3 g/dL (ref 13.0–17.0)
MCH: 32.7 pg (ref 26.0–34.0)
MCHC: 36.3 g/dL — ABNORMAL HIGH (ref 30.0–36.0)
MCV: 90 fL (ref 80.0–100.0)
Platelets: 215 10*3/uL (ref 150–400)
RBC: 4.68 MIL/uL (ref 4.22–5.81)
RDW: 11.1 % — ABNORMAL LOW (ref 11.5–15.5)
WBC: 5.9 10*3/uL (ref 4.0–10.5)
nRBC: 0 % (ref 0.0–0.2)

## 2023-07-22 LAB — CK: Total CK: 164 U/L (ref 49–397)

## 2023-07-22 LAB — URINALYSIS, ROUTINE W REFLEX MICROSCOPIC
Bilirubin Urine: NEGATIVE
Glucose, UA: NEGATIVE mg/dL
Hgb urine dipstick: NEGATIVE
Ketones, ur: NEGATIVE mg/dL
Leukocytes,Ua: NEGATIVE
Nitrite: NEGATIVE
Protein, ur: NEGATIVE mg/dL
Specific Gravity, Urine: 1.025 (ref 1.005–1.030)
pH: 5.5 (ref 5.0–8.0)

## 2023-07-22 LAB — COMPREHENSIVE METABOLIC PANEL
ALT: 19 U/L (ref 0–44)
AST: 20 U/L (ref 15–41)
Albumin: 4.1 g/dL (ref 3.5–5.0)
Alkaline Phosphatase: 39 U/L (ref 38–126)
Anion gap: 6 (ref 5–15)
BUN: 11 mg/dL (ref 6–20)
CO2: 23 mmol/L (ref 22–32)
Calcium: 9.3 mg/dL (ref 8.9–10.3)
Chloride: 109 mmol/L (ref 98–111)
Creatinine, Ser: 0.85 mg/dL (ref 0.61–1.24)
GFR, Estimated: >60 mL/min (ref >60)
Glucose, Bld: 106 mg/dL — ABNORMAL HIGH (ref 70–99)
Potassium: 4.1 mmol/L (ref 3.5–5.1)
Sodium: 138 mmol/L (ref 135–145)
Total Bilirubin: 0.4 mg/dL (ref <1.2)
Total Protein: 7.4 g/dL (ref 6.5–8.1)

## 2023-07-22 LAB — TROPONIN I (HIGH SENSITIVITY): Troponin I (High Sensitivity): 5 ng/L (ref <18)

## 2023-07-22 MED ORDER — ACETAMINOPHEN 325 MG PO TABS
650.0000 mg | ORAL_TABLET | Freq: Once | ORAL | Status: AC
  Administered 2023-07-22: 650 mg via ORAL
  Filled 2023-07-22: qty 2

## 2023-07-22 MED ORDER — KETOROLAC TROMETHAMINE 15 MG/ML IJ SOLN
15.0000 mg | Freq: Once | INTRAMUSCULAR | Status: AC
  Administered 2023-07-22: 15 mg via INTRAVENOUS
  Filled 2023-07-22: qty 1

## 2023-07-22 MED ORDER — LIDOCAINE 5 % EX PTCH
2.0000 | MEDICATED_PATCH | CUTANEOUS | Status: DC
  Administered 2023-07-22: 2 via TRANSDERMAL
  Filled 2023-07-22: qty 2

## 2023-07-22 MED ORDER — IOHEXOL 350 MG/ML SOLN
100.0000 mL | Freq: Once | INTRAVENOUS | Status: AC | PRN
  Administered 2023-07-22: 100 mL via INTRAVENOUS

## 2023-07-22 MED ORDER — METHOCARBAMOL 500 MG PO TABS: 500.0000 mg | ORAL_TABLET | Freq: Three times a day (TID) | ORAL | 0 refills | Status: AC | PRN

## 2023-07-22 MED ORDER — HYDROMORPHONE HCL 1 MG/ML IJ SOLN
1.0000 mg | Freq: Once | INTRAMUSCULAR | Status: AC
  Administered 2023-07-22: 1 mg via INTRAVENOUS
  Filled 2023-07-22: qty 1

## 2023-07-22 MED ORDER — METHOCARBAMOL 500 MG PO TABS
500.0000 mg | ORAL_TABLET | Freq: Once | ORAL | Status: AC
  Administered 2023-07-22: 500 mg via ORAL
  Filled 2023-07-22: qty 1

## 2023-07-22 MED ORDER — LIDOCAINE 4 % EX PTCH: 1.0000 | MEDICATED_PATCH | CUTANEOUS | 0 refills | Status: AC

## 2023-07-22 MED ORDER — DEXAMETHASONE 4 MG PO TABS
4.0000 mg | ORAL_TABLET | Freq: Once | ORAL | Status: AC
  Administered 2023-07-22: 4 mg via ORAL
  Filled 2023-07-22: qty 1

## 2023-07-22 NOTE — ED Notes (Signed)
US at bedside

## 2023-07-22 NOTE — ED Triage Notes (Signed)
The patient having lower back pain. Then numbness to his right arm for 4 days.

## 2023-07-22 NOTE — ED Provider Notes (Signed)
[facility] Provider Note   CSN: [id number] Arrival date & time: 07/22/23  0946     History  Chief Complaint  Patient presents with   Numbness   Back Pain    Stephen Mccarthy is a 47 y.o. male.  47 year old male presenting emergency department with multiple complaints.  Complains of right arm pain with numbness to fingertips of the entire hand.  No weakness.  Reports throbbing pain proximal to the whole arm.  Has chronic neck pain.  No reported trauma to the neck or arm recently.  He also notes that he has chronic low back pain and has had prior spinal fusion, he states that he has been having different pain character to his left flank this morning today describes it as sharp.  No weakness in lower extremity, no saddle anesthesia.  No difficulty urinating.  Denies any fevers.  He did have some nausea and was lightheaded yesterday   Back Pain      Home Medications Prior to Admission medications   Medication Sig Start Date End Date Taking? Authorizing Provider  lidocaine 4 % Place 1 patch onto the skin daily for 10 days. 07/22/23 08/01/23 Yes Athenia Rys, Harmon Dun, DO  methocarbamol (ROBAXIN) 500 MG tablet Take 1 tablet (500 mg total) by mouth every 8 (eight) hours as needed for up to 3 days for muscle spasms. 07/22/23 07/25/23 Yes Myrth Dahan, Harmon Dun, DO  doxycycline (VIBRAMYCIN) 100 MG capsule Take 1 capsule (100 mg total) by mouth 2 (two) times daily. 03/16/23   Pollyann Savoy, MD  predniSONE (DELTASONE) 20 MG tablet 3 po once a day for 2 days, then 2 po once a day for 3 days, then 1 po once a day for 3 days 01/07/20   Cathren Laine, MD      Allergies    Sulfamethoxazole-trimethoprim and Sulfa antibiotics    Review of Systems   Review of Systems  Musculoskeletal:  Positive for back pain.    Physical Exam Updated Vital Signs BP (!) 129/97   Pulse (!) 48   Temp 98.5 F (36.9 C) (Oral)   Resp 18   Ht 6' (1.829 m)   Wt 88.5 kg   SpO2  98%   BMI 26.45 kg/m  Physical Exam Vitals and nursing note reviewed.  Constitutional:      Comments: Uncomfortable appearing  HENT:     Head: Normocephalic.     Nose: Nose normal.  Neck:     Comments: Patient with some minor tenderness to the cervical spine  Cardiovascular:     Rate and Rhythm: Normal rate and regular rhythm.  Pulmonary:     Effort: Pulmonary effort is normal.  Abdominal:     General: Abdomen is flat. There is no distension.     Tenderness: There is abdominal tenderness (mild). There is no guarding or rebound.  Musculoskeletal:     Comments: Some tenderness to the lumbar spine.  Tenderness to the left flank.  5 out of 5 plantarflexion 5 out of 5 dorsiflexion, 5 out of 5 hip extension.  Normal sensation in lower extremities.  In upper extremity there is some swelling to the forearm and distal bicep and right forearm compared to the left.  Soft compartments.  2+ radial pulses.  Patient has good motor strength in bilateral upper extremities.  Right hand with subjective paresthesia to the fingertips and somewhat to the palm.  Skin:    General: Skin is warm.  Capillary Refill: Capillary refill takes less than 2 seconds.  Neurological:     Mental Status: He is alert and oriented to person, place, and time.  Psychiatric:        Mood and Affect: Mood normal.        Behavior: Behavior normal.     ED Results / Procedures / Treatments   Labs (all labs ordered are listed, but only abnormal results are displayed) Labs Reviewed  CBC - Abnormal; Notable for the following components:      Result Value   MCHC 36.3 (*)    RDW 11.1 (*)    All other components within normal limits  COMPREHENSIVE METABOLIC PANEL - Abnormal; Notable for the following components:   Glucose, Bld 106 (*)    All other components within normal limits  CK  URINALYSIS, ROUTINE W REFLEX MICROSCOPIC  TROPONIN I (HIGH SENSITIVITY)    EKG EKG Interpretation Date/Time:  Sunday July 22 2023  09:53:50 EST Ventricular Rate:  62 PR Interval:  167 QRS Duration:  96 QT Interval:  389 QTC Calculation: 395 R Axis:   28  Text Interpretation: Sinus rhythm Consider left atrial enlargement ST elev, probable normal early repol pattern Confirmed by Estanislado Pandy [phone]) on 07/22/2023 10:00:50 AM  Radiology CT Angio Chest/Abd/Pel for Dissection W and/or Wo Contrast Result Date: 07/22/2023 CLINICAL DATA:  Back pain, numbness.  AAS suspected. EXAM: CT ANGIOGRAPHY CHEST, ABDOMEN AND PELVIS TECHNIQUE: Non-contrast CT of the chest was initially obtained. Multidetector CT imaging through the chest, abdomen and pelvis was performed using the standard protocol during bolus administration of intravenous contrast. Multiplanar reconstructed images and MIPs were obtained and reviewed to evaluate the vascular anatomy. RADIATION DOSE REDUCTION: This exam was performed according to the departmental dose-optimization program which includes automated exposure control, adjustment of the mA and/or kV according to patient size and/or use of iterative reconstruction technique. CONTRAST:  OMNIPAQUE IOHEXOL 350 MG/ML SOLN COMPARISON:  Chest 05/17/2014, abdomen 01/14/2019. FINDINGS: CTA CHEST FINDINGS Cardiovascular: No aortic aneurysm or dissection. No pulmonary artery filling defects to suggest PE. No cardiomegaly or pericardial effusion. Mediastinum/Nodes: No enlarged mediastinal, hilar, or axillary lymph nodes. Thyroid gland, trachea, and esophagus demonstrate no significant findings. Lungs/Pleura: Dependent basilar subsegmental atelectasis. No pneumonia or pulmonary edema. No pneumothorax or pleural effusion. Musculoskeletal: Mild bilateral gynecomastia. No acute thoracic osseous abnormalities. Review of the MIP images confirms the above findings. CTA ABDOMEN AND PELVIS FINDINGS VASCULAR Aorta: Normal caliber aorta without aneurysm, dissection, vasculitis or significant stenosis. Celiac: Patent without evidence of  aneurysm, dissection, vasculitis or significant stenosis. SMA: Patent without evidence of aneurysm, dissection, vasculitis or significant stenosis. Renals: Both renal arteries are patent without evidence of aneurysm, dissection, vasculitis, fibromuscular dysplasia or significant stenosis. IMA: Patent without evidence of aneurysm, dissection, vasculitis or significant stenosis. Inflow: Patent without evidence of aneurysm, dissection, vasculitis or significant stenosis. Veins: No obvious venous abnormality within the limitations of this arterial phase study. Review of the MIP images confirms the above findings. NON-VASCULAR Hepatobiliary: No focal liver abnormality is seen. No gallstones, gallbladder Molnar thickening, or biliary dilatation. Pancreas: Unremarkable. No pancreatic ductal dilatation or surrounding inflammatory changes. Spleen: Normal in size without focal abnormality. Adrenals/Urinary Tract: Adrenal glands are unremarkable. Kidneys are normal, without renal calculi, focal lesion, or hydronephrosis. Bladder is unremarkable. Stomach/Bowel: Stomach is within normal limits. Appendix appears normal. No evidence of bowel [place] thickening, distention, or inflammatory changes. Lymphatic: No suspicious adenopathy. Reproductive: Prostate is unremarkable. Other: No abdominal [place] hernia or abnormality. No  abdominopelvic ascites. Musculoskeletal: Postop changes L4-5 fusion. Lumbosacral degenerative disc disease. No acute osseous abnormalities. Review of the MIP images confirms the above findings. IMPRESSION: 1. No aortic aneurysm or dissection. 2. No acute intrathoracic, abdominal or pelvic pathology identified. Electronically Signed   By: Layla Maw M.D.   On: 07/22/2023 12:30   CT Cervical Spine Wo Contrast Result Date: 07/22/2023 CLINICAL DATA:  Cervical radiculopathy. Numbness extending into the right arm for 4 days. EXAM: CT CERVICAL SPINE WITHOUT CONTRAST TECHNIQUE: Multidetector CT imaging of the  cervical spine was performed without intravenous contrast. Multiplanar CT image reconstructions were also generated. RADIATION DOSE REDUCTION: This exam was performed according to the departmental dose-optimization program which includes automated exposure control, adjustment of the mA and/or kV according to patient size and/or use of iterative reconstruction technique. COMPARISON:  CT of the cervical spine 04/09/2017. MR of the cervical spine 05/12/2017. FINDINGS: Alignment: Slight retrolisthesis at C3-4 is stable. No other significant listhesis is present. Mild straightening of the normal cervical lordosis is stable. Skull base and vertebrae: The craniocervical junction is normal. The vertebral body heights are normal. No acute or healing fractures are present. Soft tissues and spinal canal: The visualized soft tissues the neck are unremarkable. Disc levels:  C2-3: Negative. C3-4: A leftward disc osteophyte complex is present. Partial effacement of ventral CSF is present. Progressive moderate left foraminal stenosis is present. C4-5: Negative. C5-6: A mild broad-based disc osteophyte complex is present. Partial effacement of the ventral CSF is present. Mild foraminal narrowing bilaterally secondary to uncovertebral spurring. C6-7: A leftward disc osteophyte complex partially effaces the ventral CSF. Moderate to severe left and moderate right foraminal stenosis has progressed. C7-T1: Negative. Upper chest: The lung apices are clear. The thoracic inlet is within normal limits. IMPRESSION: 1. No acute or healing fractures. 2. Progressive moderate to severe left and moderate right foraminal stenosis at C6-7. 3. Progressive moderate left foraminal stenosis at C3-4. 4. Mild foraminal narrowing bilaterally at C5-6. Electronically Signed   By: Marin Roberts M.D.   On: 07/22/2023 12:17   US Venous Img Upper Uni Right(DVT) Result Date: 07/22/2023 CLINICAL DATA:  Right upper extremity pain and swelling. EXAM:  RIGHT UPPER EXTREMITY VENOUS DOPPLER ULTRASOUND TECHNIQUE: Gray-scale sonography with graded compression, as well as color Doppler and duplex ultrasound were performed to evaluate the upper extremity deep venous system from the level of the subclavian vein and including the jugular, axillary, basilic, radial, ulnar and upper cephalic vein. Spectral Doppler was utilized to evaluate flow at rest and with distal augmentation maneuvers. COMPARISON:  None Available. FINDINGS: Contralateral Subclavian Vein: Respiratory phasicity is normal and symmetric with the symptomatic side. No evidence of thrombus. Normal compressibility. Internal Jugular Vein: No evidence of thrombus. Normal compressibility, respiratory phasicity and response to augmentation. Subclavian Vein: No evidence of thrombus. Normal compressibility, respiratory phasicity and response to augmentation. Axillary Vein: No evidence of thrombus. Normal compressibility, respiratory phasicity and response to augmentation. Cephalic Vein: No evidence of thrombus. Normal compressibility, respiratory phasicity and response to augmentation. Basilic Vein: No evidence of thrombus. Normal compressibility, respiratory phasicity and response to augmentation. Brachial Veins: No evidence of thrombus. Normal compressibility, respiratory phasicity and response to augmentation. Radial Veins: No evidence of thrombus. Normal compressibility, respiratory phasicity and response to augmentation. Ulnar Veins: No evidence of thrombus. Normal compressibility, respiratory phasicity and response to augmentation. Venous Reflux:  None visualized. Other Findings:  None visualized. IMPRESSION: No evidence of DVT within the right upper extremity. Electronically Signed   By: Ivin Booty  Pleasure M.D.   On: 07/22/2023 11:14    Procedures Procedures    Medications Ordered in ED Medications  lidocaine (LIDODERM) 5 % 2 patch (2 patches Transdermal Patch Applied 07/22/23 1028)  HYDROmorphone  (DILAUDID) injection 1 mg (1 mg Intravenous Given 07/22/23 1028)  methocarbamol (ROBAXIN) tablet 500 mg (500 mg Oral Given 07/22/23 1029)  iohexol (OMNIPAQUE) 350 MG/ML injection 100 mL (100 mLs Intravenous Contrast Given 07/22/23 1149)  ketorolac (TORADOL) 15 MG/ML injection 15 mg (15 mg Intravenous Given 07/22/23 1321)  acetaminophen (TYLENOL) tablet 650 mg (650 mg Oral Given 07/22/23 1320)  dexamethasone (DECADRON) tablet 4 mg (4 mg Oral Given 07/22/23 1321)    ED Course/ Medical Decision Making/ A&P Clinical Course as of 07/22/23 1534  Sun Jul 22, 2023  1004 MRI of cervical spine in 2018: "IMPRESSION:  1. Broad posterior disc bulge at C6-7 with resultant mild canal with  moderate left C7 foraminal stenosis.  2. Mild disc bulge with tiny central disc extrusion at C3-4 without  significant canal stenosis. Mild left C4 foraminal narrowing due to  uncovertebral hypertrophy.  3. Mild right C6 foraminal stenosis due to uncovertebral disease.  " [TY]  1038 Comprehensive metabolic panel(!) No metabolic derangements.  Normal kidney function.  No transaminitis to suggest hepatobiliary disease [TY]  1038 CBC(!) No leukocytosis to suggest infection.  Hemoglobin normal [TY]  1038 CK Total: 164 Acute rhabdo less likely [TY]  1038 Urinalysis, Routine w reflex microscopic -Urine, Clean Catch No evidence of UTI/pyelo or hematuria to suggest kidney stone [TY]  1231 CT Cervical Spine Wo Contrast IMPRESSION: 1. No acute or healing fractures. 2. Progressive moderate to severe left and moderate right foraminal stenosis at C6-7. 3. Progressive moderate left foraminal stenosis at C3-4. 4. Mild foraminal narrowing bilaterally at C5-6.   Electronically Signed   [TY]  1238 CT Angio Chest/Abd/Pel for Dissection W and/or Wo Contrast IMPRESSION: 1. No aortic aneurysm or dissection. 2. No acute intrathoracic, abdominal or pelvic pathology identified.   Electronically Signed   [TY]  1243  Reevaluated; is feeling improved.  Paresthesia to fingers still present, but improved.  Workup today is reassuring.  No significant derangements to labs as noted in ED course.  Imaging without acute pathology.  No I suspect his right upper extremity paresthesia is secondary to radiculopathy.  Discussed follow-up with neurosurgery.  Patient has family member that will come get him.  Stable for discharge at this time. [TY]    Clinical Course User Index [TY] Coral Spikes, DO                                 Medical Decision Making 47 year old male presenting with multiple complaints.  Afebrile, nontachycardic slightly hypertensive on arrival maintaining ox saturation on room air.  He is uncomfortable appearing, per chart review has history of low back pain with prior repair.  He reports that his pain to the low back is different than his typical pain and not responsive to his home pain medications.  Per chart review appears that he does have some cervical radiculopathy.  He seemingly has a mixed picture for his presentation today.  There is some mild swelling to his right arm compared to his left.  No reported trauma or did not pass out on his arm.  However, he does smell of marijuana.  Will check for CK.  Will also get ultrasound to exclude DVT.  I do suspect that  some of his symptoms are radicular in nature.  Will get CT cervical spine.  However given his neurosymptoms and new onset flank pain concern for possible dissection.  Will get CTA to evaluate.  Given the degree of his pain we will give IV Dilaudid as well as Robaxin and lidocaine patch.  See ED course for further MDM disposition.  Amount and/or Complexity of Data Reviewed Labs: ordered. Decision-making details documented in ED Course. Radiology: ordered. Decision-making details documented in ED Course.  Risk OTC drugs. Prescription drug management.         Final Clinical Impression(s) / ED Diagnoses Final diagnoses:  Cervical  radiculopathy  Chronic left-sided low back pain without sciatica    Rx / DC Orders ED Discharge Orders          Ordered    methocarbamol (ROBAXIN) 500 MG tablet  Every 8 hours PRN        07/22/23 1246    lidocaine 4 %  Every 24 hours        07/22/23 1246              Coral Spikes, DO 07/22/23 1534
